# Patient Record
Sex: Female | Born: 1985 | Race: White | Hispanic: No | Marital: Married | Smoking: Never smoker
Health system: Southern US, Community
[De-identification: ages and names within clinical notes are randomized; demographics above are authoritative.]

## PROBLEM LIST (undated history)

## (undated) DIAGNOSIS — T7840XA Allergy, unspecified, initial encounter: Secondary | ICD-10-CM

## (undated) DIAGNOSIS — F32A Depression, unspecified: Secondary | ICD-10-CM

## (undated) DIAGNOSIS — F419 Anxiety disorder, unspecified: Secondary | ICD-10-CM

## (undated) DIAGNOSIS — K219 Gastro-esophageal reflux disease without esophagitis: Secondary | ICD-10-CM

## (undated) DIAGNOSIS — A6000 Herpesviral infection of urogenital system, unspecified: Secondary | ICD-10-CM

## (undated) DIAGNOSIS — F329 Major depressive disorder, single episode, unspecified: Secondary | ICD-10-CM

## (undated) DIAGNOSIS — M26629 Arthralgia of temporomandibular joint, unspecified side: Secondary | ICD-10-CM

## (undated) DIAGNOSIS — G43009 Migraine without aura, not intractable, without status migrainosus: Secondary | ICD-10-CM

## (undated) HISTORY — PX: APPENDECTOMY: SHX54

## (undated) HISTORY — PX: TONSILLECTOMY: SHX5217

## (undated) HISTORY — DX: Gastro-esophageal reflux disease without esophagitis: K21.9

## (undated) HISTORY — DX: Migraine without aura, not intractable, without status migrainosus: G43.009

## (undated) HISTORY — DX: Depression, unspecified: F32.A

## (undated) HISTORY — DX: Allergy, unspecified, initial encounter: T78.40XA

## (undated) HISTORY — PX: BREAST BIOPSY: SHX20

## (undated) HISTORY — DX: Major depressive disorder, single episode, unspecified: F32.9

## (undated) HISTORY — DX: Anxiety disorder, unspecified: F41.9

## (undated) HISTORY — DX: Herpesviral infection of urogenital system, unspecified: A60.00

## (undated) HISTORY — DX: Arthralgia of temporomandibular joint, unspecified side: M26.629

---

## 2010-08-17 ENCOUNTER — Ambulatory Visit (HOSPITAL_COMMUNITY)
Admission: RE | Admit: 2010-08-17 | Discharge: 2010-08-17 | Payer: Self-pay | Source: Home / Self Care | Attending: Family Medicine | Admitting: Family Medicine

## 2010-08-18 ENCOUNTER — Encounter (INDEPENDENT_AMBULATORY_CARE_PROVIDER_SITE_OTHER): Payer: Self-pay | Admitting: Surgery

## 2010-08-18 ENCOUNTER — Inpatient Hospital Stay (HOSPITAL_COMMUNITY)
Admission: EM | Admit: 2010-08-18 | Discharge: 2010-08-18 | Payer: Self-pay | Source: Home / Self Care | Attending: General Surgery | Admitting: General Surgery

## 2010-11-23 LAB — CBC
HCT: 37.5 % (ref 36.0–46.0)
Hemoglobin: 12.9 g/dL (ref 12.0–15.0)
MCHC: 34.4 g/dL (ref 30.0–36.0)
MCV: 86.6 fL (ref 78.0–100.0)
RBC: 4.33 MIL/uL (ref 3.87–5.11)
RDW: 13.3 % (ref 11.5–15.5)

## 2010-11-23 LAB — DIFFERENTIAL
Basophils Absolute: 0 10*3/uL (ref 0.0–0.1)
Eosinophils Relative: 0 % (ref 0–5)
Monocytes Absolute: 0.7 10*3/uL (ref 0.1–1.0)
Monocytes Relative: 4 % (ref 3–12)
Neutro Abs: 16.8 10*3/uL — ABNORMAL HIGH (ref 1.7–7.7)

## 2011-08-25 ENCOUNTER — Ambulatory Visit (INDEPENDENT_AMBULATORY_CARE_PROVIDER_SITE_OTHER): Payer: PRIVATE HEALTH INSURANCE | Admitting: Physician Assistant

## 2011-08-25 ENCOUNTER — Encounter: Payer: Self-pay | Admitting: Physician Assistant

## 2011-08-25 DIAGNOSIS — F341 Dysthymic disorder: Secondary | ICD-10-CM

## 2011-08-25 DIAGNOSIS — M26629 Arthralgia of temporomandibular joint, unspecified side: Secondary | ICD-10-CM | POA: Insufficient documentation

## 2011-08-25 DIAGNOSIS — J309 Allergic rhinitis, unspecified: Secondary | ICD-10-CM | POA: Insufficient documentation

## 2011-08-25 DIAGNOSIS — F411 Generalized anxiety disorder: Secondary | ICD-10-CM

## 2011-08-25 DIAGNOSIS — F419 Anxiety disorder, unspecified: Secondary | ICD-10-CM

## 2011-08-25 DIAGNOSIS — F329 Major depressive disorder, single episode, unspecified: Secondary | ICD-10-CM

## 2011-08-25 DIAGNOSIS — G43009 Migraine without aura, not intractable, without status migrainosus: Secondary | ICD-10-CM

## 2011-08-25 DIAGNOSIS — A6000 Herpesviral infection of urogenital system, unspecified: Secondary | ICD-10-CM | POA: Insufficient documentation

## 2011-10-13 ENCOUNTER — Encounter: Payer: Self-pay | Admitting: Physician Assistant

## 2011-10-13 ENCOUNTER — Ambulatory Visit (INDEPENDENT_AMBULATORY_CARE_PROVIDER_SITE_OTHER): Payer: PRIVATE HEALTH INSURANCE | Admitting: Physician Assistant

## 2011-10-13 VITALS — BP 124/76 | HR 100 | Temp 98.4°F | Resp 16 | Ht 62.5 in | Wt 170.6 lb

## 2011-10-13 DIAGNOSIS — J019 Acute sinusitis, unspecified: Secondary | ICD-10-CM

## 2011-10-13 DIAGNOSIS — F988 Other specified behavioral and emotional disorders with onset usually occurring in childhood and adolescence: Secondary | ICD-10-CM

## 2011-10-13 MED ORDER — AMPHETAMINE-DEXTROAMPHETAMINE 10 MG PO TABS: 10.0000 mg | ORAL_TABLET | Freq: Two times a day (BID) | ORAL | Status: DC

## 2011-10-13 MED ORDER — IPRATROPIUM BROMIDE 0.03 % NA SOLN: 2.0000 | Freq: Two times a day (BID) | NASAL | Status: DC

## 2011-10-13 MED ORDER — MOXIFLOXACIN HCL 400 MG PO TABS: 400.0000 mg | ORAL_TABLET | Freq: Every day | ORAL | Status: AC

## 2011-10-13 NOTE — Progress Notes (Signed)
Subjective:    Patient ID: Kimberly Holland, female    DOB: 05-19-1986, 26 y.o.   MRN: [id number]  Sinusitis This is a recurrent problem. The current episode started 1 to 4 weeks ago. The problem is unchanged. There has been no fever. She is experiencing no pain. Associated symptoms include congestion, coughing, headaches, sinus pressure, sneezing and a sore throat. Pertinent negatives include no chills, diaphoresis, ear pain, hoarse voice, neck pain, shortness of breath or swollen glands. Treatments tried: Flonase and Zyrtec. The treatment provided no relief.   Additionally, patient believes she has ADD.  She is easily distractible, has a hard time focusing.  Did well in school because she was smart, not because she paid attention in class.  Patient completed [facility] forms.   Review of Systems  Constitutional: Positive for fatigue. Negative for fever, chills and diaphoresis.  HENT: Positive for congestion, sore throat, rhinorrhea, sneezing, postnasal drip and sinus pressure. Negative for ear pain, nosebleeds, hoarse voice, neck pain and neck stiffness.   Eyes: Negative.   Respiratory: Positive for cough. Negative for shortness of breath and wheezing.   Cardiovascular: Positive for chest pain.  Gastrointestinal: Negative for nausea, vomiting and diarrhea.  Skin: Negative for color change.  Neurological: Positive for headaches. Negative for dizziness, seizures, facial asymmetry, weakness, light-headedness and numbness.  Psychiatric/Behavioral: Positive for decreased concentration. Negative for suicidal ideas, hallucinations, behavioral problems, confusion, sleep disturbance, self-injury, dysphoric mood and agitation. The patient is nervous/anxious. The patient is not hyperactive.        Objective:   Physical Exam  Constitutional: She is oriented to person, place, and time. Vital signs are normal. She appears well-developed and well-nourished. No distress.  HENT:  Head: Normocephalic and  atraumatic.  Right Ear: Hearing, tympanic membrane, external ear and ear canal normal.  Left Ear: Hearing, tympanic membrane, external ear and ear canal normal.  Nose: Mucosal edema and rhinorrhea present. No nasal deformity.  No foreign bodies. Right sinus exhibits maxillary sinus tenderness and frontal sinus tenderness. Left sinus exhibits maxillary sinus tenderness and frontal sinus tenderness.  Mouth/Throat: Uvula is midline, oropharynx is clear and moist and mucous membranes are normal. No oropharyngeal exudate.  Eyes: Conjunctivae and EOM are normal. Pupils are equal, round, and reactive to light.  Neck: Normal range of motion. Neck supple. No thyromegaly present.  Cardiovascular: Normal rate, regular rhythm and normal heart sounds.  Exam reveals no gallop and no friction rub.   No murmur heard. Pulmonary/Chest: Effort normal and breath sounds normal.  Lymphadenopathy:    She has no cervical adenopathy.  Neurological: She is alert and oriented to person, place, and time.  Skin: Skin is warm and dry.  Psychiatric: She has a normal mood and affect. Her speech is normal and behavior is normal. Judgment and thought content normal. Cognition and memory are normal.   The patient completed the ADHD Checklist printed from [facility] and the results, reviewed by me are: Inattention: mean item score 2, total score 10/15 Hyperactivity:  mean item score 2, total score 12/15 Instability of Mood:  mean item score 2, total score 12/15 Temperamental Behavior:  mean item score 2, total score 8/15 The patient's TOTAL SCORE is 42/60  The patient completed the Childhood ADHD Checklist printed from [facility] and the total score, reviewed by me, is 42/90, with a mean item score of 1/3.         Assessment & Plan:   1. Acute sinusitis, unspecified   2. Attention deficit disorder   Avelox,  Ipratropium bromide nasal spray, Adderall. Return for re-eval of ADD in 4 weeks, sooner if  worse.

## 2011-11-10 ENCOUNTER — Encounter: Payer: Self-pay | Admitting: Physician Assistant

## 2011-11-10 ENCOUNTER — Ambulatory Visit (INDEPENDENT_AMBULATORY_CARE_PROVIDER_SITE_OTHER): Payer: PRIVATE HEALTH INSURANCE | Admitting: Physician Assistant

## 2011-11-10 VITALS — BP 108/71 | HR 83 | Temp 99.1°F | Resp 16 | Ht 62.5 in | Wt 167.4 lb

## 2011-11-10 DIAGNOSIS — L299 Pruritus, unspecified: Secondary | ICD-10-CM

## 2011-11-10 DIAGNOSIS — F988 Other specified behavioral and emotional disorders with onset usually occurring in childhood and adolescence: Secondary | ICD-10-CM | POA: Insufficient documentation

## 2011-11-10 MED ORDER — AMPHETAMINE-DEXTROAMPHETAMINE 20 MG PO TABS: 20.0000 mg | ORAL_TABLET | Freq: Two times a day (BID) | ORAL | Status: DC

## 2011-11-10 NOTE — Progress Notes (Signed)
  Subjective:    Patient ID: Kimberly Holland, female    DOB: 06-27-1986, 26 y.o.   MRN: [id number]  HPI Presents for follow-up ADD.  Finds the Adderall helpful, but not enough.  No problems with sleep or appetite.  2 weeks of itching.  Intense, to the point of leaving scratch marks and bruises.  Migratory, episodic.  Has occurred on arms, chest, back, thighs, forehead.  OTC steroid cream with some effect.  Symptoms began 2 weeks after starting Adderall.  Had run out of Zyrtec, which she typically takes daily.  No new skin care products, soaps, detergents, etc.  No pets.   Review of Systems As above.    Objective:   Physical Exam  Vital signs noted. Well-developed, well nourished WF who is awake, alert and oriented, in NAD. HEENT: [place]/AT, PERRL, EOMI.  Sclera and conjunctiva are clear.   Skin: warm and dry.  Scattered small excoriations, excoriated papules and ecchymosis, consistent with scratching.     Assessment & Plan:  1. Pruritis Unclear etiology. Restart Zyrtec.  If increase in Adderall dose exacerbates itching, will need to change products. If no improvement, would call in prednisone taper.  2. ADD. Increase Adderall to 20 mg BID. Re-eval in 4 weeks, sooner prn.

## 2011-11-10 NOTE — Patient Instructions (Signed)
Drink at least 64 ounces of water daily. Consider a humidifier for the room where you sleep. Bathe once daily. Avoid using HOT water, as it dries skin. Avoid deodorant soaps (Dial is the worst!) and stick with gentle cleansers (I like Cetaphil Liquid Cleanser). After bathing, dry off completely, then apply a thick emollient cream (I like Cetaphil Moisturizing Cream). Apply the cream twice daily, or more!  If the itching continues back on the Zyrtec and once you are stable on the higher dose of Adderall (but not worse on the higher dose) call.  I will plan to call in prednisone for you.

## 2011-12-08 ENCOUNTER — Ambulatory Visit (INDEPENDENT_AMBULATORY_CARE_PROVIDER_SITE_OTHER): Payer: PRIVATE HEALTH INSURANCE | Admitting: Physician Assistant

## 2011-12-08 VITALS — BP 112/76 | HR 87 | Temp 98.5°F | Resp 16 | Ht 63.0 in | Wt 165.8 lb

## 2011-12-08 DIAGNOSIS — J019 Acute sinusitis, unspecified: Secondary | ICD-10-CM

## 2011-12-08 DIAGNOSIS — F988 Other specified behavioral and emotional disorders with onset usually occurring in childhood and adolescence: Secondary | ICD-10-CM

## 2011-12-08 MED ORDER — MOXIFLOXACIN HCL 400 MG PO TABS: 400.0000 mg | ORAL_TABLET | Freq: Every day | ORAL | Status: AC

## 2011-12-08 MED ORDER — AMPHETAMINE-DEXTROAMPHETAMINE 20 MG PO TABS: 30.0000 mg | ORAL_TABLET | Freq: Two times a day (BID) | ORAL | Status: DC

## 2011-12-08 NOTE — Patient Instructions (Signed)
Get LOTS of rest and drink at least 64 ounces of water daily.

## 2011-12-08 NOTE — Progress Notes (Signed)
  Subjective:    Patient ID: Kimberly Holland, female    DOB: 27-May-1986, 26 y.o.   MRN: [id number]  HPI Presents for re-evaluation of anxiety, depression, ADD. Overall, doing well.  Helpful to increase dose of Adderall, still some inattentiveness on 20 mg BID. Mostly am. Afternoons don't require so much attention.  Mood is good, anxiety seems better.  Very busy wedding planning and working.  A week of recurrent upper respiratory illness.  Cough (to gag, vomit), laryngitis, ears itchy, painful, popping.  Today feels like it's moving from her sinuses down into the upper chest.  No fever, chills.  Review of Systems As above.    Objective:   Physical Exam Vital signs noted. Well-developed, well nourished WF who is awake, alert and oriented, in NAD. HEENT: [place]/AT, PERRL, EOMI.  Sclera and conjunctiva are clear.  EAC are patent, TMs are normal in appearance. Nasal mucosa is pink and moist. OP is clear. Neck: supple, non-tender, no lymphadenopathy, thyromegaly. Heart: RRR, no murmur Lungs: CTA Extremities: no cyanosis, clubbing or edema. Skin: warm and dry without rash.     Assessment & Plan:   1. Acute sinusitis, unspecified   Avelox, Mucinex.  Rest, fluids.  Continue Flonase and Zyrtec.  2. ADD (attention deficit disorder)  Increase Adderall to 30 mg BID (1.5 20 mg tabs), or 30 mg QAM, 20 mg Qafternoon.  If this regimen works well, F/U in 3 months.  If adjustment needed, F/U 4 weeks.

## 2011-12-09 ENCOUNTER — Encounter: Payer: Self-pay | Admitting: Physician Assistant

## 2012-01-18 ENCOUNTER — Telehealth: Payer: Self-pay

## 2012-01-18 MED ORDER — AMPHETAMINE-DEXTROAMPHETAMINE 20 MG PO TABS: 30.0000 mg | ORAL_TABLET | Freq: Two times a day (BID) | ORAL | Status: DC

## 2012-01-18 NOTE — Telephone Encounter (Signed)
Done and printed

## 2012-01-18 NOTE — Telephone Encounter (Signed)
Pt would like to have a refill on adderall.

## 2012-01-18 NOTE — Telephone Encounter (Signed)
Pt notified that rx is ready for pick up

## 2012-01-19 ENCOUNTER — Ambulatory Visit: Payer: PRIVATE HEALTH INSURANCE | Admitting: Physician Assistant

## 2012-03-08 ENCOUNTER — Ambulatory Visit: Payer: PRIVATE HEALTH INSURANCE | Admitting: Physician Assistant

## 2012-03-16 DIAGNOSIS — D249 Benign neoplasm of unspecified breast: Secondary | ICD-10-CM | POA: Insufficient documentation

## 2012-03-27 ENCOUNTER — Ambulatory Visit (INDEPENDENT_AMBULATORY_CARE_PROVIDER_SITE_OTHER): Payer: PRIVATE HEALTH INSURANCE | Admitting: Physician Assistant

## 2012-03-27 VITALS — BP 130/91 | HR 120 | Temp 98.0°F | Resp 16 | Ht 62.5 in | Wt 164.0 lb

## 2012-03-27 DIAGNOSIS — F988 Other specified behavioral and emotional disorders with onset usually occurring in childhood and adolescence: Secondary | ICD-10-CM

## 2012-03-27 MED ORDER — AMPHETAMINE-DEXTROAMPHETAMINE 20 MG PO TABS: 30.0000 mg | ORAL_TABLET | Freq: Two times a day (BID) | ORAL | Status: DC

## 2012-03-27 NOTE — Progress Notes (Signed)
  Subjective:    Patient ID: Kimberly Holland, female    DOB: [birth year], 26 y.o.   MRN: [id number]  HPI This 26 y.o. Female presents for refill of Adderall.  She's taking 1-2 QAM, sometimes a dose in the afternoon/evening.  She has weaned herself off the sertraline and is still feeling well.  Has married since her last visit.   Past Medical History  Diagnosis Date  . Allergy   . GERD (gastroesophageal reflux disease)   . Migraine headache without aura   . TMJ syndrome   . Anxiety   . Depression   . Genital HSV    Prior to Admission medications   Medication Sig Start Date End Date Taking? Authorizing Provider  ALPRAZolam (XANAX) 0.25 MG tablet Take 0.25 mg by mouth as needed.     Yes Historical Provider, MD  cetirizine (ZYRTEC) 10 MG tablet Take 10 mg by mouth daily.     Yes Historical Provider, MD  fluticasone (FLONASE) 50 MCG/ACT nasal spray Place 2 sprays into the nose daily as needed.     Yes Historical Provider, MD  Pseudoephedrine-Ibuprofen 30-200 MG TABS Take by mouth.   Yes Historical Provider, MD  SPRINTEC 28 0.25-35 MG-MCG tablet  09/26/11  Yes Historical Provider, MD  valACYclovir (VALTREX) 500 MG tablet Take 500 mg by mouth daily.    Yes Historical Provider, MD  amphetamine-dextroamphetamine (ADDERALL) 20 MG tablet Take 1.5 tablets (30 mg total) by mouth 2 (two) times daily. 03/27/12 04/26/12  Kalkidan Caudell Tessa Lerner, PA-C    Allergies  Allergen Reactions  . Adhesive (Tape)   . Allegra (Fexofenadine Hcl) Itching  . Codeine Nausea Only    myalgias  . Effexor Xr (Venlafaxine Hydrochloride)     fatigue  . Morphine And Related   . Azithromycin Rash  . Penicillins Rash  . Wellbutrin (Bupropion Hcl)     headache    History   Social History  . Marital Status: Married    Spouse Name: N/A    Number of Children: N/A  . Years of Education: N/A   Social History Main Topics  . Smoking status: Never Smoker   . Smokeless tobacco: None  . Alcohol Use: Yes     one glass of wine  once a wk.  . Drug Use: No  . Sexually Active: None    Review of Systems No chest pain, SOB, HA, dizziness, vision change, N/V, diarrhea, constipation, dysuria, urinary urgency or frequency, myalgias, arthralgias or rash.     Objective:   Physical Exam  Vital signs noted. Well-developed, well nourished WF who is awake, alert and oriented, in NAD. HEENT: [place]/AT, sclera and conjunctiva are clear.   Heart: RRR, no murmur Lungs: CTA      Assessment & Plan:   1. ADD (attention deficit disorder)  amphetamine-dextroamphetamine (ADDERALL) 20 MG tablet   RTC 6 months, sooner PRN

## 2012-05-07 ENCOUNTER — Telehealth: Payer: Self-pay

## 2012-05-07 DIAGNOSIS — F988 Other specified behavioral and emotional disorders with onset usually occurring in childhood and adolescence: Secondary | ICD-10-CM

## 2012-05-07 MED ORDER — AMPHETAMINE-DEXTROAMPHETAMINE 20 MG PO TABS: 30.0000 mg | ORAL_TABLET | Freq: Two times a day (BID) | ORAL | Status: DC

## 2012-05-07 NOTE — Telephone Encounter (Signed)
Pt would like refill on adderall 20 mg , (90 quantity) 3 x daily.    Mamie would like a call when ready to pick up. [phone].

## 2012-05-07 NOTE — Telephone Encounter (Signed)
Called patient to advise  °

## 2012-05-07 NOTE — Telephone Encounter (Signed)
rx printed

## 2012-05-07 NOTE — Telephone Encounter (Signed)
Ok for rf through 09/2012

## 2012-05-31 ENCOUNTER — Ambulatory Visit (INDEPENDENT_AMBULATORY_CARE_PROVIDER_SITE_OTHER): Payer: PRIVATE HEALTH INSURANCE | Admitting: Physician Assistant

## 2012-05-31 ENCOUNTER — Encounter: Payer: Self-pay | Admitting: Physician Assistant

## 2012-05-31 VITALS — BP 119/83 | HR 95 | Temp 98.5°F | Resp 16 | Ht 62.5 in | Wt 159.0 lb

## 2012-05-31 DIAGNOSIS — M542 Cervicalgia: Secondary | ICD-10-CM

## 2012-05-31 DIAGNOSIS — Z23 Encounter for immunization: Secondary | ICD-10-CM

## 2012-05-31 MED ORDER — CYCLOBENZAPRINE HCL 10 MG PO TABS: 10.0000 mg | ORAL_TABLET | Freq: Three times a day (TID) | ORAL | Status: DC | PRN

## 2012-05-31 MED ORDER — MELOXICAM 15 MG PO TABS: 15.0000 mg | ORAL_TABLET | Freq: Every day | ORAL | Status: DC

## 2012-05-31 NOTE — Patient Instructions (Signed)
The muscle relaxer may cause drowsiness, so you may only be able to take it at bedtime.  Do not take ibuprofen or naproxen while taking the meloxicam.  Apply a warm compress to the neck for 15-20 minutes 2-3 times daily, and then perform gentle range of motion of your neck.

## 2012-05-31 NOTE — Progress Notes (Signed)
Subjective:    Patient ID: Kimberly Holland, female    DOB: 05/01/1986, 26 y.o.   MRN: [id number]  HPI  This 26 y.o. female presents for evaluation of neck pain x several weeks.  Developed after sleeping at a hotel out of town.  Improved until did increased housework about a week ago.  Has improved again somewhat, but still has episodic severe pain.  Triggering HA. Ibuprofen without relief.   Review of Systems    Past Medical History  Diagnosis Date  . Allergy   . GERD (gastroesophageal reflux disease)   . Migraine headache without aura   . TMJ syndrome   . Anxiety   . Depression   . Genital HSV     Past Surgical History  Procedure Date  . Tonsillectomy     followed by post-op cauterization  . Appendectomy   . Breast biopsy     right breast; benign fibroadenoma x 3    Prior to Admission medications   Medication Sig Start Date End Date Taking? Authorizing Provider  ALPRAZolam (XANAX) 0.25 MG tablet Take 0.25 mg by mouth as needed.     Yes Historical Provider, MD  amphetamine-dextroamphetamine (ADDERALL) 20 MG tablet Take 1.5 tablets (30 mg total) by mouth 2 (two) times daily. 05/07/12 06/06/12 Yes Gracelee Stemmler S Jolinda Pinkstaff, PA-C  cetirizine (ZYRTEC) 10 MG tablet Take 10 mg by mouth daily.     Yes Historical Provider, MD  fluticasone (FLONASE) 50 MCG/ACT nasal spray Place 2 sprays into the nose daily as needed.     Yes Historical Provider, MD  SPRINTEC 28 0.25-35 MG-MCG tablet  09/26/11  Yes Historical Provider, MD  valACYclovir (VALTREX) 500 MG tablet Take 500 mg by mouth daily.    Yes Historical Provider, MD  Pseudoephedrine-Ibuprofen 30-200 MG TABS Take by mouth.    Historical Provider, MD    Allergies  Allergen Reactions  . Adhesive (Tape)   . Allegra (Fexofenadine Hcl) Itching  . Codeine Nausea Only    myalgias  . Effexor Xr (Venlafaxine Hydrochloride)     fatigue  . Morphine And Related   . Azithromycin Rash  . Penicillins Rash  . Wellbutrin (Bupropion Hcl)    headache    History   Social History  . Marital Status: Married    Spouse Name: N/A    Number of Children: N/A  . Years of Education: N/A   Occupational History  . Not on file.   Social History Main Topics  . Smoking status: Never Smoker   . Smokeless tobacco: Not on file  . Alcohol Use: Yes     one glass of wine once a wk.  . Drug Use: No  . Sexually Active: Not on file   Other Topics Concern  . Not on file   Social History Narrative  . No narrative on file    History reviewed. No pertinent family history.     Objective:   Physical Exam  Blood pressure 119/83, pulse 95, temperature 98.5 F (36.9 C), temperature source Oral, resp. rate 16, height 5' 2.5" (1.588 m), weight 159 lb (72.122 kg), last menstrual period 05/31/2012, SpO2 100.00%. Body mass index is 28.62 kg/(m^2). Well-developed, well nourished WF who is awake, alert and oriented, in NAD. HEENT: [place]/AT, sclera and conjunctiva are clear.   Neck: supple, non-tender, no lymphadenopathy, thyromegaly.  Posteriorly there is no spinal process tenderness, but mild tenderness of the right occiput.  FROM with a "stretching" sensation described at the same location.  Heart: RRR,  no murmur Lungs: normal effort, CTA Extremities: no cyanosis, clubbing or edema. Skin: warm and dry without rash.       Assessment & Plan:   1. Neck pain  cyclobenzaprine (FLEXERIL) 10 MG tablet, meloxicam (MOBIC) 15 MG tablet  2. Immunization due  Flu vaccine greater than or equal to 3yo preservative free IM, Tdap vaccine greater than or equal to 7yo IM   Patient Instructions  The muscle relaxer may cause drowsiness, so you may only be able to take it at bedtime.  Do not take ibuprofen or naproxen while taking the meloxicam.  Apply a warm compress to the neck for 15-20 minutes 2-3 times daily, and then perform gentle range of motion of your neck.

## 2012-06-05 ENCOUNTER — Ambulatory Visit: Payer: PRIVATE HEALTH INSURANCE

## 2012-06-05 ENCOUNTER — Ambulatory Visit (INDEPENDENT_AMBULATORY_CARE_PROVIDER_SITE_OTHER): Payer: PRIVATE HEALTH INSURANCE | Admitting: Family Medicine

## 2012-06-05 VITALS — BP 118/66 | HR 100 | Temp 97.7°F | Resp 16 | Ht 64.0 in | Wt 160.0 lb

## 2012-06-05 DIAGNOSIS — M25039 Hemarthrosis, unspecified wrist: Secondary | ICD-10-CM

## 2012-06-05 DIAGNOSIS — M79643 Pain in unspecified hand: Secondary | ICD-10-CM

## 2012-06-05 DIAGNOSIS — W19XXXA Unspecified fall, initial encounter: Secondary | ICD-10-CM

## 2012-06-05 DIAGNOSIS — M79609 Pain in unspecified limb: Secondary | ICD-10-CM

## 2012-06-05 DIAGNOSIS — M25539 Pain in unspecified wrist: Secondary | ICD-10-CM

## 2012-06-05 NOTE — Progress Notes (Signed)
[facility] [address], [place] [address] [phone]  Date:  06/05/2012   Name:  Kimberly Holland   DOB:  03/09/1986   MRN:  [id number]  PCP:  No primary provider on file.    Chief Complaint: Fall   History of Present Illness:  Kimberly Holland is a 26 y.o. very pleasant female patient who presents with the following:  She was bowling last night, slipped and had a FOOSH onto her left wrist.  Also bruised her left shin.  She hurts into the heel of her left hand and her thumb.  She used an OTC brace that she had at home.   She is otherwise healthy and un- hurt today  There is no chance of being pregnant today, but she and her partner may start trying next month.   Patient Active Problem List  Diagnosis  . Migraine headache without aura  . TMJ syndrome  . Genital HSV  . Anxiety and depression  . Allergic rhinitis  . ADD (attention deficit disorder)    Past Medical History  Diagnosis Date  . Allergy   . GERD (gastroesophageal reflux disease)   . Migraine headache without aura   . TMJ syndrome   . Anxiety   . Depression   . Genital HSV     Past Surgical History  Procedure Date  . Tonsillectomy     followed by post-op cauterization  . Appendectomy   . Breast biopsy     right breast; benign fibroadenoma x 3    History  Substance Use Topics  . Smoking status: Never Smoker   . Smokeless tobacco: Not on file  . Alcohol Use: Yes     one glass of wine once a wk.    No family history on file.  Allergies  Allergen Reactions  . Adhesive (Tape)   . Allegra (Fexofenadine Hcl) Itching  . Codeine Nausea Only    myalgias  . Effexor Xr (Venlafaxine Hydrochloride)     fatigue  . Morphine And Related   . Azithromycin Rash  . Penicillins Rash  . Wellbutrin (Bupropion Hcl)     headache    Medication list has been reviewed and updated.  Current Outpatient Prescriptions on File Prior to Visit  Medication Sig Dispense Refill  .  ALPRAZolam (XANAX) 0.25 MG tablet Take 0.25 mg by mouth as needed.        Marland Kitchen amphetamine-dextroamphetamine (ADDERALL) 20 MG tablet Take 1.5 tablets (30 mg total) by mouth 2 (two) times daily.  90 tablet  0  . cetirizine (ZYRTEC) 10 MG tablet Take 10 mg by mouth daily.        . cyclobenzaprine (FLEXERIL) 10 MG tablet Take 1 tablet (10 mg total) by mouth 3 (three) times daily as needed for muscle spasms.  30 tablet  0  . fluticasone (FLONASE) 50 MCG/ACT nasal spray Place 2 sprays into the nose daily as needed.        . meloxicam (MOBIC) 15 MG tablet Take 1 tablet (15 mg total) by mouth daily.  30 tablet  1  . Pseudoephedrine-Ibuprofen 30-200 MG TABS Take by mouth.      . SPRINTEC 28 0.25-35 MG-MCG tablet       . valACYclovir (VALTREX) 500 MG tablet Take 500 mg by mouth daily.         Review of Systems:  As per HPI- otherwise negative.   Physical Examination: Filed Vitals:   06/05/12 1201  BP:  118/66  Pulse: 100  Temp: 97.7 F (36.5 C)  Resp: 16   Filed Vitals:   06/05/12 1201  Height: 5\' 4"  (1.626 m)  Weight: 160 lb (72.576 kg)   Body mass index is 27.46 kg/(m^2). Ideal Body Weight: Weight in (lb) to have BMI = 25: 145.3   GEN: WDWN, NAD, Non-toxic, A & O x 3 HEENT: Atraumatic, Normocephalic. Neck supple. No masses, No LAD. Ears and Nose: No external deformity. CV: RRR, No M/G/R. No JVD. No thrill. No extra heart sounds. PULM: CTA B, no wheezes, crackles, rhonchi. No retractions. No resp. distress. No accessory muscle use. EXTR: No c/c/e.  Left wrist: there is a bruise and mild TTP over the base of the left thumb/ heel of the hand.   NEURO Normal gait.  PSYCH: Normally interactive. Conversant. Not depressed or anxious appearing.  Calm demeanor.   [facility] reading (PRIMARY) by  Dr. Patsy Lager.  Negative left hand and wrist  LEFT HAND - COMPLETE 3+ VIEW  Comparison: Preliminary reading of Dr. Patsy Lager  Findings: Three views of the left hand submitted. No acute fracture or  subluxation. No radiopaque foreign body.  IMPRESSION: No acute fracture or subluxation  LEFT WRIST - COMPLETE 3+ VIEW  Comparison: Preliminary reading Dr. Patsy Lager  Findings: Three views of the left wrist submitted. No acute fracture or subluxation. Mild negative ulnar variance.  IMPRESSION: No acute fracture or subluxation. Mild negative ulnar variance.  Assessment and Plan: 1. Fall    2. Pain, hand  DG Hand Complete Left  3. Pain, wrist  DG Wrist Complete Left   Wrist and hand sprain/ contusion.  Placed in a thumb spica splint.  If she is not better by the end of the week please give me a call   Abbe Amsterdam, MD

## 2012-06-05 NOTE — Patient Instructions (Addendum)
The radiologist will double check your x-rays, I will call if they see any problem.  Wear your splint and apply ice, and use ibuprofen as needed. If your wrist is still painful on Thursday/ Friday of this week please give me a call. Otherwise you can stop wearing the splint when you no longer feel that you need it.

## 2012-07-02 ENCOUNTER — Ambulatory Visit (INDEPENDENT_AMBULATORY_CARE_PROVIDER_SITE_OTHER): Payer: PRIVATE HEALTH INSURANCE | Admitting: Emergency Medicine

## 2012-07-02 VITALS — BP 120/66 | HR 110 | Temp 98.2°F | Resp 16 | Ht 62.0 in | Wt 160.2 lb

## 2012-07-02 DIAGNOSIS — F988 Other specified behavioral and emotional disorders with onset usually occurring in childhood and adolescence: Secondary | ICD-10-CM

## 2012-07-02 DIAGNOSIS — J019 Acute sinusitis, unspecified: Secondary | ICD-10-CM

## 2012-07-02 DIAGNOSIS — J40 Bronchitis, not specified as acute or chronic: Secondary | ICD-10-CM

## 2012-07-02 DIAGNOSIS — J018 Other acute sinusitis: Secondary | ICD-10-CM

## 2012-07-02 MED ORDER — PSEUDOEPHEDRINE-GUAIFENESIN ER 60-600 MG PO TB12: 1.0000 | ORAL_TABLET | Freq: Two times a day (BID) | ORAL | Status: DC

## 2012-07-02 MED ORDER — AMPHETAMINE-DEXTROAMPHETAMINE 30 MG PO TABS: 30.0000 mg | ORAL_TABLET | Freq: Two times a day (BID) | ORAL | Status: DC

## 2012-07-02 MED ORDER — MOMETASONE FUROATE 50 MCG/ACT NA SUSP: 2.0000 | Freq: Every day | NASAL | Status: DC

## 2012-07-02 MED ORDER — LEVOFLOXACIN 500 MG PO TABS: 500.0000 mg | ORAL_TABLET | Freq: Every day | ORAL | Status: AC

## 2012-07-02 NOTE — Progress Notes (Signed)
[facility] [address], [place] [address] [phone]  Date:  07/02/2012   Name:  Kimberly Holland   DOB:  1986-08-03   MRN:  [id number]  PCP:  No primary provider on file.    Chief Complaint: Sinusitis and Cough   History of Present Illness:  Kimberly Holland is a 26 y.o. very pleasant female patient who presents with the following:  Ill two weeks with increasing symptoms of nasal congestion and purulent drainage.  More post nasal than drainage.  Non productive cough. Has fever and chills, no nausea or vomiting, no wheezing or shortness of breath.  Moderate fatigue.  Pain and pressure in cheeks worse with coughing and sneezing.  No stool change or rash.  No improvement with OTC meds.  History of prior sinusitis.  Patient Active Problem List  Diagnosis  . Migraine headache without aura  . TMJ syndrome  . Genital HSV  . Anxiety and depression  . Allergic rhinitis  . ADD (attention deficit disorder)    Past Medical History  Diagnosis Date  . Allergy   . GERD (gastroesophageal reflux disease)   . Migraine headache without aura   . TMJ syndrome   . Anxiety   . Depression   . Genital HSV     Past Surgical History  Procedure Date  . Tonsillectomy     followed by post-op cauterization  . Appendectomy   . Breast biopsy     right breast; benign fibroadenoma x 3    History  Substance Use Topics  . Smoking status: Never Smoker   . Smokeless tobacco: Not on file  . Alcohol Use: Yes     one glass of wine once a wk.    No family history on file.  Allergies  Allergen Reactions  . Adhesive (Tape)   . Allegra (Fexofenadine Hcl) Itching  . Codeine Nausea Only    myalgias  . Effexor Xr (Venlafaxine Hydrochloride)     fatigue  . Morphine And Related   . Azithromycin Rash  . Penicillins Rash  . Wellbutrin (Bupropion Hcl)     headache    Medication list has been reviewed and updated.  Current Outpatient Prescriptions on File Prior to  Visit  Medication Sig Dispense Refill  . ALPRAZolam (XANAX) 0.25 MG tablet Take 0.25 mg by mouth as needed.        . cetirizine (ZYRTEC) 10 MG tablet Take 10 mg by mouth daily.        . cyclobenzaprine (FLEXERIL) 10 MG tablet Take 1 tablet (10 mg total) by mouth 3 (three) times daily as needed for muscle spasms.  30 tablet  0  . fluticasone (FLONASE) 50 MCG/ACT nasal spray Place 2 sprays into the nose daily as needed.        . meloxicam (MOBIC) 15 MG tablet Take 1 tablet (15 mg total) by mouth daily.  30 tablet  1  . Pseudoephedrine-Ibuprofen 30-200 MG TABS Take by mouth.      . SPRINTEC 28 0.25-35 MG-MCG tablet       . valACYclovir (VALTREX) 500 MG tablet Take 500 mg by mouth daily.       Marland Kitchen amphetamine-dextroamphetamine (ADDERALL) 20 MG tablet Take 1.5 tablets (30 mg total) by mouth 2 (two) times daily.  90 tablet  0    Review of Systems:  As per HPI, otherwise negative.    Physical Examination: Filed Vitals:   07/02/12 1006  BP: 120/66  Pulse: 110  Temp: 98.2 F (36.8 C)  Resp: 16   Filed Vitals:   07/02/12 1006  Height: 5\' 2"  (1.575 m)  Weight: 160 lb 3.2 oz (72.666 kg)   Body mass index is 29.30 kg/(m^2). Ideal Body Weight: Weight in (lb) to have BMI = 25: 136.4   GEN: WDWN, NAD, Non-toxic, A & O x 3.  No rash or sepsis.  No shortness of breath HEENT: Atraumatic, Normocephalic. Neck supple. No masses, No LAD.  Oropharynx negative Ears and Nose: No external deformity.  TM negative.  Nasal congestion and purulent drainage CV: RRR, No M/G/R. No JVD. No thrill. No extra heart sounds. PULM: CTA B, no wheezes, crackles, rhonchi. No retractions. No resp. distress. No accessory muscle use. ABD: S, NT, ND, +BS. No rebound. No HSM. EXTR: No c/c/e NEURO Normal gait.  PSYCH: Normally interactive. Conversant. Not depressed or anxious appearing.  Calm demeanor.    Assessment and Plan: Sinusitis Bronchitis Numerous allergies levaquin mucinex d nasonex  Carmelina Dane,  MD  I have reviewed and agree with documentation. Robert P. Merla Riches, M.D.

## 2012-07-23 ENCOUNTER — Other Ambulatory Visit: Payer: Self-pay | Admitting: Physician Assistant

## 2012-09-12 DIAGNOSIS — Z0271 Encounter for disability determination: Secondary | ICD-10-CM

## 2012-09-27 ENCOUNTER — Ambulatory Visit (INDEPENDENT_AMBULATORY_CARE_PROVIDER_SITE_OTHER): Payer: PRIVATE HEALTH INSURANCE | Admitting: Physician Assistant

## 2012-09-27 ENCOUNTER — Encounter: Payer: Self-pay | Admitting: Physician Assistant

## 2012-09-27 VITALS — BP 117/78 | HR 104 | Temp 97.3°F | Resp 16 | Ht 63.5 in | Wt 162.0 lb

## 2012-09-27 DIAGNOSIS — F419 Anxiety disorder, unspecified: Secondary | ICD-10-CM

## 2012-09-27 DIAGNOSIS — M542 Cervicalgia: Secondary | ICD-10-CM

## 2012-09-27 DIAGNOSIS — F988 Other specified behavioral and emotional disorders with onset usually occurring in childhood and adolescence: Secondary | ICD-10-CM

## 2012-09-27 DIAGNOSIS — F341 Dysthymic disorder: Secondary | ICD-10-CM

## 2012-09-27 MED ORDER — AMPHETAMINE-DEXTROAMPHET ER 30 MG PO CP24: 30.0000 mg | ORAL_CAPSULE | Freq: Two times a day (BID) | ORAL | Status: DC

## 2012-09-27 MED ORDER — ALPRAZOLAM 0.25 MG PO TABS: 0.2500 mg | ORAL_TABLET | ORAL | Status: DC | PRN

## 2012-09-27 MED ORDER — AMPHETAMINE-DEXTROAMPHETAMINE 30 MG PO TABS: 30.0000 mg | ORAL_TABLET | Freq: Two times a day (BID) | ORAL | Status: DC

## 2012-09-27 MED ORDER — CYCLOBENZAPRINE HCL 10 MG PO TABS: 10.0000 mg | ORAL_TABLET | Freq: Three times a day (TID) | ORAL | Status: DC | PRN

## 2012-09-27 NOTE — Progress Notes (Signed)
Subjective:    Patient ID: Kimberly Holland, female    DOB: Jul 06, 1986, 27 y.o.   MRN: [id number]  HPI  This 27 y.o. female presents for evaluation of ADD and anxiety.  She's doing really well on her current regimen, rarely needing alprazolam.  Stays focused at work, able to complete tasks.  Utilizes behavioral tools to prevent her anxiety from escalating to panic.   Past Medical History  Diagnosis Date  . Allergy   . GERD (gastroesophageal reflux disease)   . Migraine headache without aura   . TMJ syndrome   . Anxiety   . Depression   . Genital HSV     Past Surgical History  Procedure Date  . Tonsillectomy     followed by post-op cauterization  . Appendectomy   . Breast biopsy     right breast; benign fibroadenoma x 3    Prior to Admission medications   Medication Sig Start Date End Date Taking? Authorizing Provider  ALPRAZolam (XANAX) 0.25 MG tablet Take 0.25 mg by mouth as needed.     Yes Historical Provider, MD  amphetamine-dextroamphetamine (ADDERALL) 30 MG tablet Take 1 tablet (30 mg total) by mouth 2 (two) times daily. 07/02/12  Yes Phillips Odor, MD  cetirizine (ZYRTEC) 10 MG tablet Take 10 mg by mouth daily.     Yes Historical Provider, MD  fluticasone (FLONASE) 50 MCG/ACT nasal spray Place 2 sprays into the nose daily as needed.     Yes Historical Provider, MD  meloxicam (MOBIC) 15 MG tablet Take 1 tablet (15 mg total) by mouth daily. 05/31/12  Yes Aicha Clingenpeel S Demiah Gullickson, PA-C  Pseudoephedrine-Ibuprofen 30-200 MG TABS Take by mouth.   Yes Historical Provider, MD  SPRINTEC 28 0.25-35 MG-MCG tablet  09/26/11  Yes Historical Provider, MD  valACYclovir (VALTREX) 500 MG tablet Take 500 mg by mouth daily.    Yes Historical Provider, MD  amphetamine-dextroamphetamine (ADDERALL) 20 MG tablet Take 1.5 tablets (30 mg total) by mouth 2 (two) times daily. 05/07/12 06/06/12  Ladd Cen S Carry Ortez, PA-C  cyclobenzaprine (FLEXERIL) 10 MG tablet Take 1 tablet (10 mg total) by mouth 3 (three)  times daily as needed for muscle spasms. 05/31/12   Kasen Sako Tessa Lerner, PA-C    Allergies  Allergen Reactions  . Adhesive (Tape)   . Allegra (Fexofenadine Hcl) Itching  . Codeine Nausea Only    myalgias  . Effexor Xr (Venlafaxine Hydrochloride)     fatigue  . Morphine And Related   . Azithromycin Rash  . Penicillins Rash  . Wellbutrin (Bupropion Hcl)     headache    History   Social History  . Marital Status: Married    Spouse Name: Thayer Ohm    Number of Children: 0  . Years of Education: 15   Occupational History  . BOOKKEEPER    Social History Main Topics  . Smoking status: Never Smoker   . Smokeless tobacco: Never Used  . Alcohol Use: 1.0 oz/week    2 drink(s) per week     Comment: one glass of wine twice a wk.  . Drug Use: No  . Sexually Active: Yes -- Female partner(s)    Birth Control/ Protection: Pill   Other Topics Concern  . Not on file   Social History Narrative   Lives with her husband.    Family History  Problem Relation Age of Onset  . Heart disease Paternal Grandmother    Review of Systems As above.  No chest pain, SOB, HA, dizziness,  vision change, N/V, diarrhea, constipation, dysuria, urinary urgency or frequency, myalgias, arthralgias or rash.     Objective:   Physical Exam  Blood pressure 117/78, pulse 104, temperature 97.3 F (36.3 C), temperature source Oral, resp. rate 16, height 5' 3.5" (1.613 m), weight 162 lb (73.483 kg), last menstrual period 09/23/2012. Body mass index is 28.25 kg/(m^2). Well-developed, well nourished WF who is awake, alert and oriented, in NAD. HEENT: [place]/AT, sclera and conjunctiva are clear.   Neck: supple, non-tender, no lymphadenopathy, thyromegaly. Heart: RRR, no murmur Lungs: normal effort, CTA Extremities: no cyanosis, clubbing or edema. Skin: warm and dry without rash. Psychologic: good mood and appropriate affect, normal speech and behavior.     Assessment & Plan:   1. ADD (attention deficit disorder)   amphetamine-dextroamphetamine (ADDERALL) 30 MG tablet, amphetamine-dextroamphetamine (ADDERALL XR) 30 MG 24 hr capsule, amphetamine-dextroamphetamine (ADDERALL XR) 30 MG 24 hr capsule  2. Anxiety and depression  ALPRAZolam (XANAX) 0.25 MG tablet  3. Neck pain  cyclobenzaprine (FLEXERIL) 10 MG tablet   RTC 3 months, sooner prn.

## 2012-11-06 ENCOUNTER — Other Ambulatory Visit: Payer: Self-pay | Admitting: Radiology

## 2012-11-06 DIAGNOSIS — F988 Other specified behavioral and emotional disorders with onset usually occurring in childhood and adolescence: Secondary | ICD-10-CM

## 2012-11-06 NOTE — Telephone Encounter (Signed)
Pt states the adderal should be

## 2012-11-06 NOTE — Telephone Encounter (Signed)
Please advise. [facility] has Rx on hold. The new Rx is for Adderall XR, patient has been taking regular Adderall. Insurance will not cover Adderall XR bid. Please advise.

## 2012-11-08 NOTE — Telephone Encounter (Signed)
Please advise on Adderall XR should this be for bid, insurance will not cover, they will cover regular Adderall bid.

## 2012-11-08 NOTE — Telephone Encounter (Signed)
It looks like I inadvertently wrote one Rx for the regular, and 2 for the XR.  They should all be regular. If she'll bring the two XR prescriptions back, I'll give her replacements that are correct.

## 2012-11-09 NOTE — Telephone Encounter (Signed)
Called pharmacy they have shredded old ones. Can replace and have patient pick up. pended

## 2012-11-13 MED ORDER — AMPHETAMINE-DEXTROAMPHETAMINE 30 MG PO TABS: 30.0000 mg | ORAL_TABLET | Freq: Two times a day (BID) | ORAL | Status: DC

## 2012-12-27 ENCOUNTER — Ambulatory Visit: Payer: Self-pay | Admitting: Physician Assistant

## 2013-04-04 ENCOUNTER — Ambulatory Visit (INDEPENDENT_AMBULATORY_CARE_PROVIDER_SITE_OTHER): Payer: PRIVATE HEALTH INSURANCE | Admitting: Physician Assistant

## 2013-04-04 ENCOUNTER — Encounter: Payer: Self-pay | Admitting: Physician Assistant

## 2013-04-04 VITALS — BP 116/72 | HR 88 | Temp 98.8°F | Resp 16 | Ht 62.5 in | Wt 160.8 lb

## 2013-04-04 DIAGNOSIS — F341 Dysthymic disorder: Secondary | ICD-10-CM

## 2013-04-04 DIAGNOSIS — F988 Other specified behavioral and emotional disorders with onset usually occurring in childhood and adolescence: Secondary | ICD-10-CM

## 2013-04-04 DIAGNOSIS — F329 Major depressive disorder, single episode, unspecified: Secondary | ICD-10-CM

## 2013-04-04 DIAGNOSIS — F419 Anxiety disorder, unspecified: Secondary | ICD-10-CM

## 2013-04-04 MED ORDER — AMPHETAMINE-DEXTROAMPHETAMINE 30 MG PO TABS: 30.0000 mg | ORAL_TABLET | Freq: Two times a day (BID) | ORAL | Status: DC

## 2013-04-04 NOTE — Progress Notes (Signed)
I directly supervised and participated in the procedure and agree with the student's documentation.  

## 2013-04-04 NOTE — Progress Notes (Signed)
  Subjective:    Patient ID: Kimberly Holland, female    DOB: [birth year], 27 y.o.   MRN: [id number]  HPI 27 y.o. Female presents to clinic today for recheck of her medications. She is feeling much better now that she has been taking Xanax and Adderall. Pt states that she is able to stay awake at work now and actually focus. She feels like the Adderall is helping with her anxiety as well as ADD and so she has been taking less of the xanax. She has been taking 30 mg 2x/day of the Adderall but she says often she only takes it once and she does not feel like she needs to take it on the weekends. She states that as long as she does not take the Adderall too late in the day she is able to sleep fine. She is not having any palpitations, headaches, excessive change in weight, or changes in vision. She has stopped taking the Flexeril and Mobic because she is no longer having neck pain. She is continuing to take other medications as directed without complications.    Review of Systems  Constitutional: Negative for fever, chills, activity change and appetite change.  Eyes: Negative for visual disturbance.  Respiratory: Negative for shortness of breath.   Cardiovascular: Negative for chest pain and palpitations.  Gastrointestinal: Negative for nausea, vomiting, abdominal pain and diarrhea.  Neurological: Negative for dizziness, light-headedness and headaches.  Psychiatric/Behavioral: The patient is nervous/anxious.   All other systems reviewed and are negative.       Objective:   Physical Exam  Constitutional: She is oriented to person, place, and time. Vital signs are normal. She appears well-developed and well-nourished. No distress.  HENT:  Head: Normocephalic and atraumatic.  Right Ear: External ear normal.  Left Ear: External ear normal.  Eyes: Conjunctivae are normal. Pupils are equal, round, and reactive to light.  Neck: Normal range of motion. Neck supple.  Cardiovascular: Normal rate,  regular rhythm and normal heart sounds.   Pulmonary/Chest: Effort normal and breath sounds normal.  Musculoskeletal: Normal range of motion.  Lymphadenopathy:    She has no cervical adenopathy.  Neurological: She is alert and oriented to person, place, and time.  Skin: Skin is warm and dry. She is not diaphoretic.  Psychiatric: She has a normal mood and affect. Her behavior is normal. Judgment and thought content normal.       Assessment & Plan:  ADD (attention deficit disorder) - Plan: amphetamine-dextroamphetamine (ADDERALL) 30 MG tablet  Pt has Anxiety and depression. She should continue mediations as directed. F/u in 6 months for recheck.

## 2013-04-04 NOTE — Patient Instructions (Signed)
Keep up the great work!

## 2013-08-05 ENCOUNTER — Other Ambulatory Visit: Payer: Self-pay

## 2013-08-05 DIAGNOSIS — F988 Other specified behavioral and emotional disorders with onset usually occurring in childhood and adolescence: Secondary | ICD-10-CM

## 2013-08-05 NOTE — Telephone Encounter (Signed)
amphetamine-dextroamphetamine (ADDERALL) 30 MG tablet   [id number]

## 2013-08-05 NOTE — Telephone Encounter (Signed)
Pended please advise.  

## 2013-08-06 MED ORDER — AMPHETAMINE-DEXTROAMPHETAMINE 30 MG PO TABS: 30.0000 mg | ORAL_TABLET | Freq: Two times a day (BID) | ORAL | Status: DC

## 2013-08-07 NOTE — Telephone Encounter (Signed)
Patient was advised this is at front desk for pick up

## 2013-10-17 ENCOUNTER — Ambulatory Visit (INDEPENDENT_AMBULATORY_CARE_PROVIDER_SITE_OTHER): Payer: PRIVATE HEALTH INSURANCE | Admitting: Physician Assistant

## 2013-10-17 ENCOUNTER — Encounter: Payer: Self-pay | Admitting: Physician Assistant

## 2013-10-17 VITALS — BP 110/70 | HR 74 | Temp 98.0°F | Resp 16 | Ht 62.0 in | Wt 152.8 lb

## 2013-10-17 DIAGNOSIS — G8929 Other chronic pain: Secondary | ICD-10-CM

## 2013-10-17 DIAGNOSIS — F32A Depression, unspecified: Secondary | ICD-10-CM

## 2013-10-17 DIAGNOSIS — J309 Allergic rhinitis, unspecified: Secondary | ICD-10-CM

## 2013-10-17 DIAGNOSIS — F419 Anxiety disorder, unspecified: Secondary | ICD-10-CM

## 2013-10-17 DIAGNOSIS — F341 Dysthymic disorder: Secondary | ICD-10-CM

## 2013-10-17 DIAGNOSIS — R6884 Jaw pain: Secondary | ICD-10-CM

## 2013-10-17 DIAGNOSIS — F988 Other specified behavioral and emotional disorders with onset usually occurring in childhood and adolescence: Secondary | ICD-10-CM

## 2013-10-17 DIAGNOSIS — F329 Major depressive disorder, single episode, unspecified: Secondary | ICD-10-CM

## 2013-10-17 MED ORDER — AMPHETAMINE-DEXTROAMPHETAMINE 30 MG PO TABS: 30.0000 mg | ORAL_TABLET | Freq: Two times a day (BID) | ORAL | Status: DC

## 2013-10-17 MED ORDER — ALPRAZOLAM 0.25 MG PO TABS: 0.2500 mg | ORAL_TABLET | ORAL | Status: DC | PRN

## 2013-10-17 MED ORDER — MELOXICAM 15 MG PO TABS: 15.0000 mg | ORAL_TABLET | Freq: Every day | ORAL | Status: DC

## 2013-10-17 NOTE — Progress Notes (Signed)
Subjective:    Patient ID: Kimberly Holland, female    DOB: 1986/06/24, 28 y.o.   MRN: [id number]  HPI  Patient presents for follow up of ADD and medication refills.   Patient reports that Adderall is working well to control ADD symptoms. No change in appetite and sleeping well at night.   Patient reports that she has social anxiety for which she takes Xanax. She was prescribed a #30 prescription 09/27/12 which lasted all year. She is requesting a refill today.   Patient reports frequent episodes of epistaxis, sinus pressure/burning. She takes zyrtec daily. Uses flonase as needed when her allergies flare. Denies nasal congestion, cough, sore throat, ear pain and dental pain.   Patient reports that her jaw pain has been worse recently which has been causing her headaches. She has a night guard for jaw which has not been helping her recently. Previously, she could wear the guard when her jaw would begin to hurt for one night and she would not experience symptoms for a couple of months - but that has not seemed to be working recently.   Review of Systems  Constitutional: Negative for fever, chills and unexpected weight change.  Respiratory: Negative for shortness of breath and wheezing.   Cardiovascular: Negative for chest pain and palpitations.  Gastrointestinal: Negative for nausea, vomiting, diarrhea, constipation and blood in stool.  Genitourinary: Negative for dysuria and hematuria.  Neurological: Negative for dizziness and light-headedness.       Objective:   Physical Exam  Constitutional: She is oriented to person, place, and time. Vital signs are normal. She appears well-developed and well-nourished.  HENT:  Head: Normocephalic and atraumatic.  Right Ear: Tympanic membrane, external ear and ear canal normal.  Left Ear: Tympanic membrane, external ear and ear canal normal.  Nose: Nose normal.  Mouth/Throat: Uvula is midline, oropharynx is clear and moist and mucous membranes  are normal.  Eyes: Conjunctivae are normal.  Neck: Neck supple.  Cardiovascular: Normal rate, regular rhythm and normal heart sounds.   Pulmonary/Chest: Effort normal and breath sounds normal.  Lymphadenopathy:       Head (right side): No submental, no submandibular, no tonsillar, no preauricular, no posterior auricular and no occipital adenopathy present.       Head (left side): No submental, no submandibular, no tonsillar, no preauricular, no posterior auricular and no occipital adenopathy present.    She has no cervical adenopathy.       Right: No supraclavicular adenopathy present.       Left: No supraclavicular adenopathy present.  Neurological: She is alert and oriented to person, place, and time.  Skin: Skin is warm and dry.  Psychiatric: She has a normal mood and affect. Her behavior is normal. Judgment and thought content normal.       Assessment & Plan:   1. ADD (attention deficit disorder) Well controlled. Continue medication as prescribed.  - amphetamine-dextroamphetamine (ADDERALL) 30 MG tablet; Take 1 tablet (30 mg total) by mouth 2 (two) times daily.  Dispense: 60 tablet; Refill: 0  2. Anxiety and depression Well controlled. Continue medication as prescribed.  - ALPRAZolam (XANAX) 0.25 MG tablet; Take 1 tablet (0.25 mg total) by mouth as needed.  Dispense: 30 tablet; Refill: 0  3. Allergic rhinitis Continue taking Zyrtec daily. Use flonase daily. If no improvement, will add Singulair.   4. Chronic jaw pain Take 1 Meloxicam daily for jaw pain. Encouraged patient to wear nightguard every night for improvement of jaw pain.  -  meloxicam (MOBIC) 15 MG tablet; Take 1 tablet (15 mg total) by mouth daily.  Dispense: 30 tablet; Refill: 1

## 2013-10-17 NOTE — Patient Instructions (Signed)
Use the Night Guard every night. Take the anti-inflammatory as needed for pain. If not helpful, please contact your oral surgeon. Use the Flonase every day. If your allergy symptoms persist, we can add Singulair. Using a saline nasal spray can help reduce the nose bleeds, as well as drinking plenty of water.

## 2013-10-18 NOTE — Progress Notes (Signed)
I have examined this patient along with the student and agree.  She reports that her current night guard is less than 29 year old.  Her oral surgeon advised surgery, but that her insurance plan doesn't cover it and she cannot afford it.  Her night guard use has been very intermittent, only once every month or so with complete relief after one night until the past couple of weeks. If she gets insufficient relief with nightly use of the guard and meloxicam, she's advised to contact her oral surgeon again.

## 2013-11-29 IMAGING — CR DG WRIST COMPLETE 3+V*L*
1 series · 1 of 1 positions shown · non-contrast
Comparison: Preliminary reading Dr. Zemra E

CLINICAL DATA: Fall, pain

LEFT WRIST - COMPLETE 3+ VIEW

[PA]
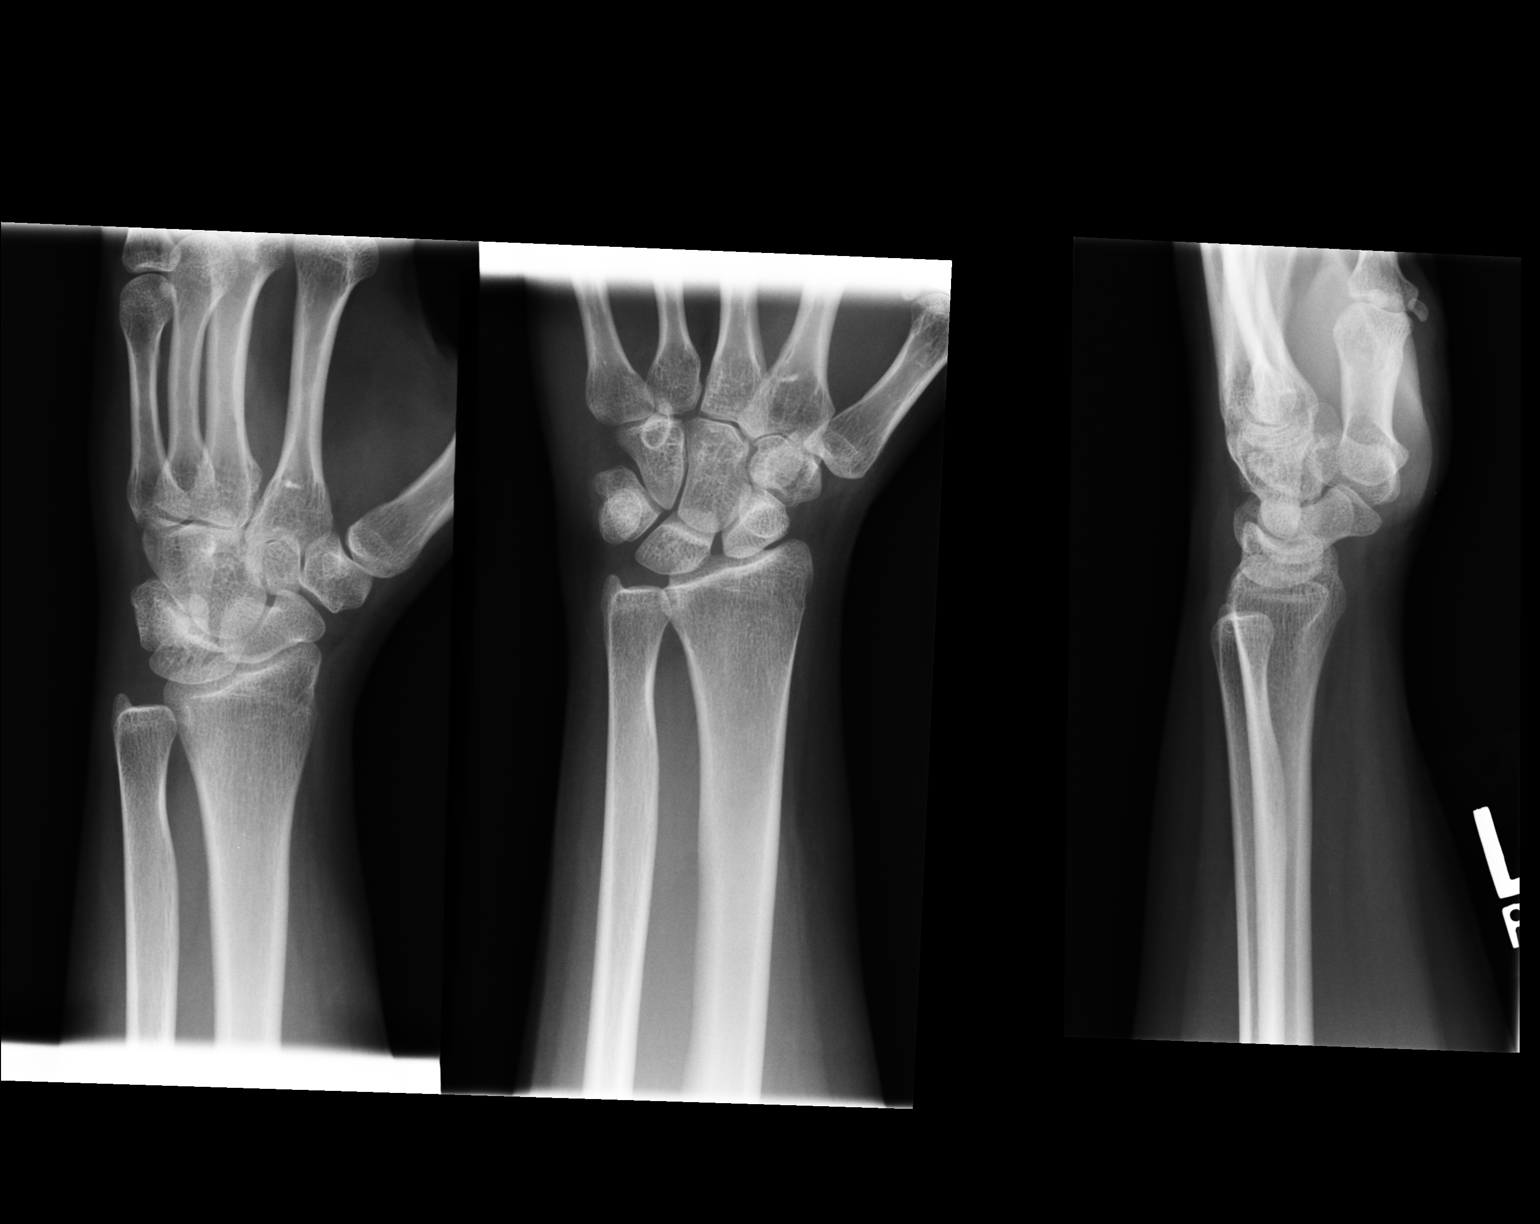

[1 of 1 positions shown; findings below may reference images not displayed]

FINDINGS: Three views of the left wrist submitted.  No acute
fracture or subluxation.  Mild negative ulnar variance.
IMPRESSION: No acute fracture or subluxation.  Mild negative ulnar variance.

## 2014-02-10 ENCOUNTER — Telehealth: Payer: Self-pay | Admitting: Physician Assistant

## 2014-02-10 DIAGNOSIS — F988 Other specified behavioral and emotional disorders with onset usually occurring in childhood and adolescence: Secondary | ICD-10-CM

## 2014-02-11 MED ORDER — AMPHETAMINE-DEXTROAMPHETAMINE 30 MG PO TABS: 30.0000 mg | ORAL_TABLET | Freq: Two times a day (BID) | ORAL | Status: DC

## 2014-02-11 NOTE — Telephone Encounter (Signed)
Patient notified via My Chart.  Meds ordered this encounter  Medications  . amphetamine-dextroamphetamine (ADDERALL) 30 MG tablet    Sig: Take 1 tablet (30 mg total) by mouth 2 (two) times daily.    Dispense:  60 tablet    Refill:  0    Order Specific Question:  Supervising Provider    Answer:  DOOLITTLE, ROBERT P [[id number]]  . amphetamine-dextroamphetamine (ADDERALL) 30 MG tablet    Sig: Take 1 tablet (30 mg total) by mouth 2 (two) times daily. Fill 30 days after date of prescription    Dispense:  60 tablet    Refill:  0    Order Specific Question:  Supervising Provider    Answer:  DOOLITTLE, ROBERT P [[id number]]  . amphetamine-dextroamphetamine (ADDERALL) 30 MG tablet    Sig: Take 1 tablet (30 mg total) by mouth 2 (two) times daily. Fill 60 days after date of prescription    Dispense:  60 tablet    Refill:  0    Order Specific Question:  Supervising Provider    Answer:  DOOLITTLE, ROBERT P [[id number]]

## 2014-05-05 ENCOUNTER — Other Ambulatory Visit: Payer: Self-pay | Admitting: Physician Assistant

## 2014-05-05 DIAGNOSIS — F988 Other specified behavioral and emotional disorders with onset usually occurring in childhood and adolescence: Secondary | ICD-10-CM

## 2014-05-05 MED ORDER — AMPHETAMINE-DEXTROAMPHETAMINE 30 MG PO TABS: 30.0000 mg | ORAL_TABLET | Freq: Two times a day (BID) | ORAL | Status: DC

## 2014-05-05 NOTE — Telephone Encounter (Signed)
Notified pt on VM and mychart that Rxs are ready.

## 2014-05-20 DIAGNOSIS — G9332 Myalgic encephalomyelitis/chronic fatigue syndrome: Secondary | ICD-10-CM | POA: Insufficient documentation

## 2014-05-20 DIAGNOSIS — D8989 Other specified disorders involving the immune mechanism, not elsewhere classified: Secondary | ICD-10-CM | POA: Insufficient documentation

## 2014-05-20 DIAGNOSIS — R5382 Chronic fatigue, unspecified: Secondary | ICD-10-CM

## 2014-09-18 ENCOUNTER — Ambulatory Visit: Payer: PRIVATE HEALTH INSURANCE | Admitting: Physician Assistant

## 2015-04-28 ENCOUNTER — Encounter: Payer: Self-pay | Admitting: Physician Assistant

## 2015-04-28 ENCOUNTER — Ambulatory Visit (INDEPENDENT_AMBULATORY_CARE_PROVIDER_SITE_OTHER): Payer: No Typology Code available for payment source | Admitting: Physician Assistant

## 2015-04-28 VITALS — BP 96/60 | HR 102 | Temp 98.6°F | Resp 16 | Ht 62.5 in | Wt 160.2 lb

## 2015-04-28 DIAGNOSIS — Z3A14 14 weeks gestation of pregnancy: Secondary | ICD-10-CM

## 2015-04-28 DIAGNOSIS — Z0000 Encounter for general adult medical examination without abnormal findings: Secondary | ICD-10-CM | POA: Diagnosis not present

## 2015-04-28 DIAGNOSIS — J302 Other seasonal allergic rhinitis: Secondary | ICD-10-CM

## 2015-04-28 DIAGNOSIS — R040 Epistaxis: Secondary | ICD-10-CM

## 2015-04-28 NOTE — Patient Instructions (Addendum)
Continue drinking plenty of water, at least 64 ounces daily.  For nose bleeds and dry nostrils, you can try placing Vaseline in your nose to keep the area moist. You can also try sleeping with a humidifier.   Continue to avoid your adderall and ibuprofen. Take tylenol as needed for pain.  Book recommendations: So That's What They're For

## 2015-04-28 NOTE — Progress Notes (Signed)
   Subjective:    Patient ID: Kimberly Holland, female    DOB: 09/05/1986, 29 y.o.   MRN: [id number]  HPI Pt presents for CPE. She is a G1P0 who is 14 months pregnant. She has been seeing Fenton Malling, PA-C, at [facility] since July, her next appointment is in 2 days. 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 She has increased her water intake since pregnancy and has tried to increase her vegetable intake. Pt does self breast exams at home and has PMH of R lumpectomy, benign histology. Pt is having no nausea, unless she forgets to eat, and denies vaginal leakage, bleeding, or abdominal pain. Pt gets one mild HA per week that goes away with tylenol. Denies CP, SOB, palpitations, diarrhea, constipation, increased thirst, dysuria, hematuria. Nosebleeds have gotten worse, and she has a PMH of nasal polyps. Wears a seatbelt in the car. Review of Systems  Constitutional: Negative.   HENT: Positive for nosebleeds, postnasal drip and sneezing. Negative for rhinorrhea.        Epistaxis; 1-2x per week, usually bleeds for 1-2 minutes. Last week had one episode that lasted for 15 minutes.  Zyrtec controls itching but does not help sinus pressure.  Respiratory: Negative.   Cardiovascular: Negative.   Gastrointestinal: Negative.   Endocrine: Negative.   Genitourinary: Negative.   Musculoskeletal: Negative.   Skin: Negative.   Neurological: Negative.       Objective:   Physical Exam  Constitutional: She is oriented to person, place, and time. She appears well-developed and well-nourished. No distress.  HENT:  Head: Normocephalic and atraumatic.  Right Ear: External ear normal.  Left Ear: External ear normal.  Mouth/Throat: Oropharynx is clear and moist. No oropharyngeal exudate.  Post nasal drip  BL erythema and blood in nares.   Abdominal: Soft. Bowel sounds are normal. She exhibits no distension and no mass. There is no tenderness. There is  no rebound and no guarding.  fundus palpated approximately midway between symphisis pubis and umbilicus.  Neurological: She is alert and oriented to person, place, and time.  Skin: Skin is warm and dry. No rash noted. She is not diaphoretic. No erythema. No pallor.  Psychiatric: She has a normal mood and affect. Her behavior is normal. Judgment and thought content normal.      Assessment & Plan:  1. Encounter for annual health examination 2. [redacted] weeks gestation of pregnancy Prenatal Vit-Fit Fumarate-FA (prenatal vitamin PO)  3. Other seasonal allergic rhinitis  cetirizine 10 mg tablet PO daily  4. Epistaxis Nasal saline, humidified air, vasaline

## 2015-04-30 ENCOUNTER — Encounter: Payer: Self-pay | Admitting: Physician Assistant

## 2015-04-30 NOTE — Progress Notes (Signed)
Patient ID: Kimberly Holland, female    DOB: 05/16/1986, 29 y.o.   MRN: [id number]  PCP: Alayza Pieper, PA-C  Chief Complaint  Patient presents with  . Annual Exam    NO PAP SMEAR    Subjective:   HPI: Presents for annual exam. She is [redacted] weeks pregnant with her first pregnancy.  She has increased her water intake since pregnancy and has tried to increase her vegetable intake. Pt does self breast exams at home and has PMH of R lumpectomy, benign histology. Pt is having no nausea, unless she forgets to eat, and denies vaginal leakage, bleeding, or abdominal pain. Pt gets one mild HA per week that goes away with tylenol. Denies CP, SOB, palpitations, diarrhea, constipation, increased thirst, dysuria, hematuria. Nosebleeds have gotten worse, and she has a PMH of nasal polyps. Wears a seatbelt in the car.     Patient Active Problem List   Diagnosis Date Noted  . ADD (attention deficit disorder) 11/10/2011  . 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 Anxiety and depression 08/25/2011  . Allergic rhinitis 08/25/2011  . Migraine headache without aura   . TMJ syndrome     Past Medical History  Diagnosis Date  . Allergy   . GERD (gastroesophageal reflux disease)   . Migraine headache without aura   . TMJ syndrome   . Anxiety   . Depression   . Genital HSV      Prior to Admission medications   Medication Sig Start Date End Date Taking? Authorizing Provider  cetirizine (ZYRTEC) 10 MG tablet Take 10 mg by mouth daily.     Yes Historical Provider, MD  Prenatal Vit-Fe Fumarate-FA (PRENATAL VITAMIN PO) Take by mouth daily.   Yes Historical Provider, MD  ALPRAZolam (XANAX) 0.25 MG tablet Take 1 tablet (0.25 mg total) by mouth as needed. Patient not taking: Reported on 04/28/2015 10/17/13   Harrison Mons, PA-C  amphetamine-dextroamphetamine (ADDERALL) 30 MG tablet Take 1 tablet by mouth 2 (two) times daily. Patient not taking: Reported on 04/28/2015 05/05/14   Harrison Mons, PA-C    amphetamine-dextroamphetamine (ADDERALL) 30 MG tablet Take 1 tablet by mouth 2 (two) times daily. Fill 30 days after date of prescription Patient not taking: Reported on 04/28/2015 05/05/14   Sonji Starkes, PA-C  fluticasone (FLONASE) 50 MCG/ACT nasal spray Place 2 sprays into the nose daily as needed.      Historical Provider, MD  Pseudoephedrine-Ibuprofen 30-200 MG TABS Take by mouth.    Historical Provider, MD  valACYclovir (VALTREX) 500 MG tablet Take 500 mg by mouth daily.     Historical Provider, MD    Allergies  Allergen Reactions  . Adhesive [Tape]   . Allegra [Fexofenadine Hcl] Itching  . Codeine Nausea Only    myalgias  . Effexor Xr [Venlafaxine Hydrochloride]     fatigue  . Morphine And Related   . Azithromycin Rash  . Penicillins Rash  . Wellbutrin [Bupropion Hcl]     headache    Past Surgical History  Procedure Laterality Date  . Tonsillectomy      followed by post-op cauterization  . Appendectomy    . Breast biopsy      right breast; benign fibroadenoma x 3    Family History  Problem Relation Age of Onset  . Heart disease Paternal Grandmother     Social History   Social History  . Marital Status: Married    Spouse Name: Gerald Stabs  . Number of Children: 0  . Years of Education: 57  Occupational History  . adminstrative assistant    Social History Main Topics  . Smoking status: Never Smoker   . Smokeless tobacco: Never Used  . Alcohol Use: No  . Drug Use: No  . Sexual Activity:    Partners: Male    Birth Control/ Protection: Pill   Other Topics Concern  . None   Social History Narrative   Lives with her husband.       Review of Systems Constitutional: Negative.  HENT: Positive for nosebleeds, postnasal drip and sneezing. Negative for rhinorrhea.  Epistaxis; 1-2x per week, usually bleeds for 1-2 minutes. Last week had one episode that lasted for 15 minutes.  Zyrtec controls itching but does not help sinus pressure.  Respiratory:  Negative.  Cardiovascular: Negative.  Gastrointestinal: Negative.  Endocrine: Negative.  Genitourinary: Negative.  Musculoskeletal: Negative.  Skin: Negative.  Neurological: Negative.       Objective:  Physical Exam  Constitutional: She is oriented to person, place, and time. Vital signs are normal. She appears well-developed and well-nourished. She is active and cooperative. No distress.  BP 96/60 mmHg  Pulse 102  Temp(Src) 98.6 F (37 C) (Oral)  Resp 16  Ht 5' 2.5" (1.588 m)  Wt 160 lb 3.2 oz (72.666 kg)  BMI 28.82 kg/m2  LMP 01/21/2015   HENT:  Head: Normocephalic and atraumatic.  Right Ear: Hearing, tympanic membrane, external ear and ear canal normal. No foreign bodies.  Left Ear: Hearing, tympanic membrane, external ear and ear canal normal. No foreign bodies.  Nose: Nose normal.  Mouth/Throat: Uvula is midline, oropharynx is clear and moist and mucous membranes are normal. No oral lesions. Normal dentition. No dental abscesses or uvula swelling. No oropharyngeal exudate.  Eyes: Conjunctivae, EOM and lids are normal. Pupils are equal, round, and reactive to light. Right eye exhibits no discharge. Left eye exhibits no discharge. No scleral icterus.  Fundoscopic exam:      The right eye shows no arteriolar narrowing, no AV nicking, no exudate, no hemorrhage and no papilledema. The right eye shows red reflex.       The left eye shows no arteriolar narrowing, no AV nicking, no exudate, no hemorrhage and no papilledema. The left eye shows red reflex.  Neck: Trachea normal, normal range of motion and full passive range of motion without pain. Neck supple. No spinous process tenderness and no muscular tenderness present. No thyroid mass and no thyromegaly present.  Cardiovascular: Normal rate, regular rhythm, normal heart sounds, intact distal pulses and normal pulses.   Pulmonary/Chest: Effort normal and breath sounds normal.  Musculoskeletal: She exhibits no edema or tenderness.        Cervical back: Normal.       Thoracic back: Normal.       Lumbar back: Normal.  Lymphadenopathy:       Head (right side): No tonsillar, no preauricular, no posterior auricular and no occipital adenopathy present.       Head (left side): No tonsillar, no preauricular, no posterior auricular and no occipital adenopathy present.    She has no cervical adenopathy.       Right: No supraclavicular adenopathy present.       Left: No supraclavicular adenopathy present.  Neurological: She is alert and oriented to person, place, and time. She has normal strength and normal reflexes. No cranial nerve deficit. She exhibits normal muscle tone. Coordination and gait normal.  Skin: Skin is warm, dry and intact. No rash noted. She is not diaphoretic. No cyanosis  or erythema. Nails show no clubbing.  Psychiatric: She has a normal mood and affect. Her speech is normal and behavior is normal. Judgment and thought content normal.           Assessment & Plan:  1. Encounter for annual health examination Age appropriate anticipatory guidance provided.  2. [redacted] weeks gestation of pregnancy Continue with OBGYN.   3. Other seasonal allergic rhinitis Continue current treatment.  4. Epistaxis Continue treatment of allergies, hydration, consider Vaseline inside the nares.   Fara Chute, PA-C Physician Assistant-Certified [facility]

## 2016-01-26 ENCOUNTER — Encounter: Payer: Self-pay | Admitting: Physician Assistant

## 2016-01-26 ENCOUNTER — Ambulatory Visit (INDEPENDENT_AMBULATORY_CARE_PROVIDER_SITE_OTHER): Payer: No Typology Code available for payment source | Admitting: Physician Assistant

## 2016-01-26 VITALS — BP 116/79 | HR 80 | Temp 98.4°F | Resp 16 | Ht 63.0 in | Wt 206.0 lb

## 2016-01-26 DIAGNOSIS — F909 Attention-deficit hyperactivity disorder, unspecified type: Secondary | ICD-10-CM | POA: Diagnosis not present

## 2016-01-26 DIAGNOSIS — F988 Other specified behavioral and emotional disorders with onset usually occurring in childhood and adolescence: Secondary | ICD-10-CM

## 2016-01-26 MED ORDER — AMPHETAMINE-DEXTROAMPHETAMINE 30 MG PO TABS: 30.0000 mg | ORAL_TABLET | Freq: Two times a day (BID) | ORAL | Status: AC

## 2016-01-26 NOTE — Patient Instructions (Signed)
We recommend that you schedule a mammogram for breast cancer screening. Typically, you do not need a referral to do this. Please contact a local imaging center to schedule your mammogram.  [facility] - [phone]  *ask for the Radiology Department [facility]) - [phone] or [phone]  [facility] - [phone] [place] [phone] [facility] - [phone]  *ask for the [facility] - [phone]  *ask for the Radiology Department [facility] - [phone]  *ask for the [facility] - [phone]    IF you received an x-ray today, you will receive an invoice from [facility]. Please contact [facility] at [phone] with questions or concerns regarding your invoice.   IF you received labwork today, you will receive an invoice from [employer]. Please contact [facility] at [phone] with questions or concerns regarding your invoice.   Our billing staff will not be able to assist you with questions regarding bills from these companies.  You will be contacted with the lab results as soon as they are available. The fastest way to get your results is to activate your My Chart account. Instructions are located on the last page of this paperwork. If you have not heard from [place] regarding the results in 2 weeks, please contact this office.

## 2016-01-26 NOTE — Progress Notes (Signed)
Patient ID: Kimberly Holland, female    DOB: 1985-09-22, 30 y.o.   MRN: [id number]  PCP: Wynne Dust  Subjective:   Chief Complaint  Patient presents with  . Medication Refill    Adderall  . Medication Management    HPI Presents for evaluation of ADD.  She has been on Adderall for a number of years, and stopped it for her recent pregnancy. Her son was born in February. She tried breast-feeding x 3 weeks and then stopped, deciding that it wasn't for her.  She is ready to resume Adderall.  Feels good, just tired. Is able to nap when the baby does. Has good support from her husband and the grandparents. 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    Review of Systems As above.    Patient Active Problem List   Diagnosis Date Noted  . CFIDS (chronic fatigue and immune dysfunction syndrome) 05/20/2014  . 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 ADD (attention deficit disorder) 11/10/2011  . 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 Anxiety and depression 08/25/2011  . Allergic rhinitis 08/25/2011  . Migraine headache without aura   . TMJ syndrome      Prior to Admission medications   Medication Sig Start Date End Date Taking? Authorizing Provider  amphetamine-dextroamphetamine (ADDERALL) 30 MG tablet Take 1 tablet by mouth 2 (two) times daily. 05/05/14  Yes Keah Lamba, PA-C  amphetamine-dextroamphetamine (ADDERALL) 30 MG tablet Take 1 tablet by mouth 2 (two) times daily. Fill 30 days after date of prescription 05/05/14  Yes Paislei Dorval, PA-C  cetirizine (ZYRTEC) 10 MG tablet Take 10 mg by mouth daily.     Yes Historical Provider, MD  norgestimate-ethinyl estradiol (ORTHO-CYCLEN,SPRINTEC,PREVIFEM) 0.25-35 MG-MCG tablet Take 1 tablet by mouth daily.   Yes Historical Provider, MD  ferrous sulfate 325 (65 FE) MG tablet Take by mouth. Reported on 01/26/2016 10/30/15 10/29/16  Historical Provider, MD  Prenatal Vit-Fe  Fumarate-FA (PRENATAL VITAMINS) 28-0.8 MG TABS Take by mouth. Reported on 01/26/2016    Historical Provider, MD  valACYclovir (VALTREX) 500 MG tablet Take 500 mg by mouth daily. Reported on 01/26/2016    Historical Provider, MD     Allergies  Allergen Reactions  . Adhesive [Tape]   . Allegra [Fexofenadine Hcl] Itching  . Codeine Nausea Only    myalgias  . Effexor Xr [Venlafaxine Hydrochloride]     fatigue  . Morphine And Related   . Azithromycin Rash  . Penicillins Rash  . Wellbutrin [Bupropion Hcl]     headache       Objective:  Physical Exam  Constitutional: She is oriented to person, place, and time. She appears well-developed and well-nourished. She is active and cooperative. No distress.  BP 116/79 mmHg  Pulse 80  Temp(Src) 98.4 F (36.9 C)  Resp 16  Ht 5\' 3"  (1.6 m)  Wt 206 lb (93.441 kg)  BMI 36.50 kg/m2  LMP 12/31/2014  Breastfeeding? No  HENT:  Head: Normocephalic and atraumatic.  Right Ear: Hearing normal.  Left Ear: Hearing normal.  Eyes: Conjunctivae are normal. No scleral icterus.  Neck: Normal range of motion. Neck supple. No thyromegaly present.  Cardiovascular: Normal rate, regular rhythm and normal heart sounds.   Pulses:      Radial pulses are 2+ on the right side, and 2+ on the left side.  Pulmonary/Chest: Effort normal and breath sounds normal.  Lymphadenopathy:  Head (right side): No tonsillar, no preauricular, no posterior auricular and no occipital adenopathy present.       Head (left side): No tonsillar, no preauricular, no posterior auricular and no occipital adenopathy present.    She has no cervical adenopathy.       Right: No supraclavicular adenopathy present.       Left: No supraclavicular adenopathy present.  Neurological: She is alert and oriented to person, place, and time. No sensory deficit.  Skin: Skin is warm, dry and intact. No rash noted. No cyanosis or erythema. Nails show no clubbing.  Psychiatric: She has a normal mood  and affect. Her speech is normal and behavior is normal.           Assessment & Plan:   1. ADD (attention deficit disorder) Resume Adderall. May call in 3 months for prescriptions, follow-up in 6 months. - amphetamine-dextroamphetamine (ADDERALL) 30 MG tablet; Take 1 tablet by mouth 2 (two) times daily.  Dispense: 60 tablet; Refill: 0 - amphetamine-dextroamphetamine (ADDERALL) 30 MG tablet; Take 1 tablet by mouth 2 (two) times daily.  Dispense: 60 tablet; Refill: 0 - amphetamine-dextroamphetamine (ADDERALL) 30 MG tablet; Take 1 tablet by mouth 2 (two) times daily.  Dispense: 60 tablet; Refill: 0   Fara Chute, PA-C Physician Assistant-Certified [facility]

## 2016-03-30 ENCOUNTER — Other Ambulatory Visit: Payer: Self-pay

## 2016-03-30 NOTE — Telephone Encounter (Signed)
[employer] req PA on last refil for Amphetamine 30mg  #30. - Covermy meds approved. Pharmacy called.

## 2016-07-26 ENCOUNTER — Ambulatory Visit: Payer: No Typology Code available for payment source | Admitting: Physician Assistant

## 2017-12-14 ENCOUNTER — Encounter: Payer: Self-pay | Admitting: Physician Assistant
